# Patient Record
Sex: Female | Born: 1980 | Race: White | Hispanic: No | Marital: Married | Smoking: Never smoker
Health system: Southern US, Community
[De-identification: ages and names within clinical notes are randomized; demographics above are authoritative.]

## PROBLEM LIST (undated history)

## (undated) DIAGNOSIS — A630 Anogenital (venereal) warts: Secondary | ICD-10-CM

## (undated) DIAGNOSIS — B019 Varicella without complication: Secondary | ICD-10-CM

## (undated) HISTORY — DX: Varicella without complication: B01.9

## (undated) HISTORY — DX: Anogenital (venereal) warts: A63.0

---

## 2004-06-05 ENCOUNTER — Other Ambulatory Visit: Admission: RE | Admit: 2004-06-05 | Discharge: 2004-06-05 | Payer: Self-pay | Admitting: Obstetrics and Gynecology

## 2004-06-06 ENCOUNTER — Other Ambulatory Visit: Admission: RE | Admit: 2004-06-06 | Discharge: 2004-06-06 | Payer: Self-pay | Admitting: Obstetrics and Gynecology

## 2005-06-09 ENCOUNTER — Other Ambulatory Visit: Admission: RE | Admit: 2005-06-09 | Discharge: 2005-06-09 | Payer: Self-pay | Admitting: Obstetrics and Gynecology

## 2008-05-22 ENCOUNTER — Ambulatory Visit: Payer: Self-pay | Admitting: Family Medicine

## 2008-05-23 ENCOUNTER — Encounter (INDEPENDENT_AMBULATORY_CARE_PROVIDER_SITE_OTHER): Payer: Self-pay | Admitting: *Deleted

## 2008-05-23 LAB — CONVERTED CEMR LAB
AST: 25 units/L (ref 0–37)
Alkaline Phosphatase: 38 units/L — ABNORMAL LOW (ref 39–117)
BUN: 10 mg/dL (ref 6–23)
Basophils Absolute: 0.1 10*3/uL (ref 0.0–0.1)
Bilirubin, Direct: 0.1 mg/dL (ref 0.0–0.3)
Chloride: 104 meq/L (ref 96–112)
Eosinophils Absolute: 0.1 10*3/uL (ref 0.0–0.7)
Eosinophils Relative: 1.8 % (ref 0.0–5.0)
GFR calc Af Amer: 155 mL/min
GFR calc non Af Amer: 128 mL/min
HDL: 58.8 mg/dL (ref >39.0)
MCV: 94 fL (ref 78.0–100.0)
Neutrophils Relative %: 61 % (ref 43.0–77.0)
Platelets: 288 10*3/uL (ref 150–400)
Potassium: 3.5 meq/L (ref 3.5–5.1)
RDW: 12.7 % (ref 11.5–14.6)
Sodium: 137 meq/L (ref 135–145)
Total Bilirubin: 0.7 mg/dL (ref 0.3–1.2)
Triglycerides: 77 mg/dL (ref 0–149)
VLDL: 15 mg/dL (ref 0–40)
WBC: 6.9 10*3/uL (ref 4.5–10.5)

## 2012-04-19 ENCOUNTER — Ambulatory Visit (INDEPENDENT_AMBULATORY_CARE_PROVIDER_SITE_OTHER): Payer: BC Managed Care – PPO | Admitting: Family Medicine

## 2012-04-19 ENCOUNTER — Encounter: Payer: Self-pay | Admitting: Family Medicine

## 2012-04-19 VITALS — BP 108/80 | HR 74 | Temp 98.4°F | Ht 61.25 in | Wt 161.8 lb

## 2012-04-19 DIAGNOSIS — Z13220 Encounter for screening for lipoid disorders: Secondary | ICD-10-CM

## 2012-04-19 DIAGNOSIS — Z0000 Encounter for general adult medical examination without abnormal findings: Secondary | ICD-10-CM

## 2012-04-19 NOTE — Assessment & Plan Note (Signed)
Pt's PE WNL.  UTD on GYN.  Flu shot given.  Check labs.  Anticipatory guidance provided.  

## 2012-04-19 NOTE — Progress Notes (Signed)
  Subjective:    Patient ID: Debbie Arroyo, female    DOB: 1981/07/27, 31 y.o.   MRN: [id number]  HPI CPE- UTD on GYN Dareen Piano).  No concerns today.  Would like a flu shot.   Review of Systems Patient reports no vision/ hearing changes, adenopathy,fever, weight change,  persistant/recurrent hoarseness , swallowing issues, chest pain, palpitations, edema, persistant/recurrent cough, hemoptysis, dyspnea (rest/exertional/paroxysmal nocturnal), gastrointestinal bleeding (melena, rectal bleeding), abdominal pain, significant heartburn, bowel changes, GU symptoms (dysuria, hematuria, incontinence), Gyn symptoms (abnormal  bleeding, pain),  syncope, focal weakness, memory loss, numbness & tingling, skin/hair/nail changes, abnormal bruising or bleeding, anxiety, or depression.     Objective:   Physical Exam General Appearance:    Alert, cooperative, no distress, appears stated age  Head:    Normocephalic, without obvious abnormality, atraumatic  Eyes:    PERRL, conjunctiva/corneas clear, EOM's intact, fundi    benign, both eyes  Ears:    Normal TM's and external ear canals, both ears  Nose:   Nares normal, septum midline, mucosa normal, no drainage    or sinus tenderness  Throat:   Lips, mucosa, and tongue normal; teeth and gums normal  Neck:   Supple, symmetrical, trachea midline, no adenopathy;    Thyroid: no enlargement/tenderness/nodules  Back:     Symmetric, no curvature, ROM normal, no CVA tenderness  Lungs:     Clear to auscultation bilaterally, respirations unlabored  Chest Wall:    No tenderness or deformity   Heart:    Regular rate and rhythm, S1 and S2 normal, no murmur, rub   or gallop  Breast Exam:    Deferred to GYN  Abdomen:     Soft, non-tender, bowel sounds active all four quadrants,    no masses, no organomegaly  Genitalia:    Deferred to GYN  Rectal:    Extremities:   Extremities normal, atraumatic, no cyanosis or edema  Pulses:   2+ and symmetric all extremities    Skin:   Skin color, texture, turgor normal, no rashes or lesions  Lymph nodes:   Cervical, supraclavicular, and axillary nodes normal  Neurologic:   CNII-XII intact, normal strength, sensation and reflexes    throughout          Assessment & Plan:

## 2012-04-19 NOTE — Patient Instructions (Addendum)
Follow up in 1 year or as needed You look great!  Keep up the good work! We'll notify you of your lab results Call with any questions or concerns CONGRATS on your marriage!!!

## 2012-04-20 LAB — BASIC METABOLIC PANEL
BUN: 11 mg/dL (ref 6–23)
Calcium: 8.6 mg/dL (ref 8.4–10.5)
Chloride: 97 mEq/L (ref 96–112)
Creatinine, Ser: 0.7 mg/dL (ref 0.4–1.2)

## 2012-04-20 LAB — LDL CHOLESTEROL, DIRECT: Direct LDL: 140.2 mg/dL

## 2012-04-20 LAB — LIPID PANEL
Cholesterol: 210 mg/dL — ABNORMAL HIGH (ref 0–200)
Total CHOL/HDL Ratio: 4
Triglycerides: 121 mg/dL (ref 0.0–149.0)

## 2012-04-20 LAB — CBC WITH DIFFERENTIAL/PLATELET
Basophils Absolute: 0.1 10*3/uL (ref 0.0–0.1)
Basophils Relative: 0.9 % (ref 0.0–3.0)
Eosinophils Absolute: 0.1 10*3/uL (ref 0.0–0.7)
HCT: 38.7 % (ref 36.0–46.0)
Hemoglobin: 12.6 g/dL (ref 12.0–15.0)
Lymphs Abs: 2.6 10*3/uL (ref 0.7–4.0)
MCHC: 32.7 g/dL (ref 30.0–36.0)
MCV: 94.3 fl (ref 78.0–100.0)
Neutro Abs: 4.8 10*3/uL (ref 1.4–7.7)
RBC: 4.1 Mil/uL (ref 3.87–5.11)
RDW: 13.2 % (ref 11.5–14.6)

## 2012-04-20 LAB — HEPATIC FUNCTION PANEL
ALT: 18 U/L (ref 0–35)
Bilirubin, Direct: 0 mg/dL (ref 0.0–0.3)
Total Bilirubin: 0.6 mg/dL (ref 0.3–1.2)
Total Protein: 7.6 g/dL (ref 6.0–8.3)

## 2012-04-20 LAB — TSH: TSH: 0.86 u[IU]/mL (ref 0.35–5.50)

## 2012-04-22 ENCOUNTER — Encounter: Payer: Self-pay | Admitting: *Deleted

## 2012-04-23 LAB — VITAMIN D 1,25 DIHYDROXY: Vitamin D2 1, 25 (OH)2: <8 pg/mL

## 2012-04-26 ENCOUNTER — Telehealth: Payer: Self-pay | Admitting: Family Medicine

## 2012-04-26 NOTE — Telephone Encounter (Signed)
returned your call regarding labs ok to call back later today  Cb# [phone]

## 2012-04-26 NOTE — Telephone Encounter (Signed)
Pt.notified

## 2013-11-30 ENCOUNTER — Other Ambulatory Visit (HOSPITAL_COMMUNITY): Payer: Self-pay | Admitting: Obstetrics and Gynecology

## 2013-11-30 DIAGNOSIS — O200 Threatened abortion: Secondary | ICD-10-CM

## 2013-12-01 ENCOUNTER — Ambulatory Visit (HOSPITAL_COMMUNITY)
Admission: RE | Admit: 2013-12-01 | Discharge: 2013-12-01 | Disposition: A | Discharge status: Home / Self Care | Payer: BC Managed Care – PPO | Source: Ambulatory Visit | Attending: Obstetrics and Gynecology | Admitting: Obstetrics and Gynecology

## 2013-12-01 DIAGNOSIS — O200 Threatened abortion: Secondary | ICD-10-CM

## 2013-12-01 DIAGNOSIS — O208 Other hemorrhage in early pregnancy: Secondary | ICD-10-CM | POA: Insufficient documentation

## 2015-02-18 IMAGING — US US OB COMP LESS 14 WK
1 series · 14 of 28 positions shown · non-contrast
Comparison: None.

CLINICAL DATA: Pregnant, vaginal bleeding

EXAM:
OBSTETRIC <14 WK US AND TRANSVAGINAL OB US
TECHNIQUE: Both transabdominal and transvaginal ultrasound examinations were
performed for complete evaluation of the gestation as well as the
maternal uterus, adnexal regions, and pelvic cul-de-sac.
Transvaginal technique was performed to assess early pregnancy.

[Series 1: us ob transvaginal · 51 acquisitions, 14 frames shown]
[im 2/51]
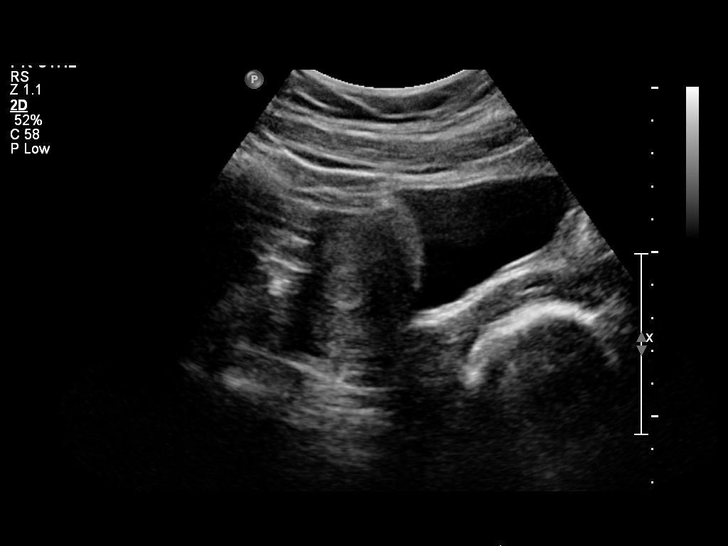
[im 6/51]
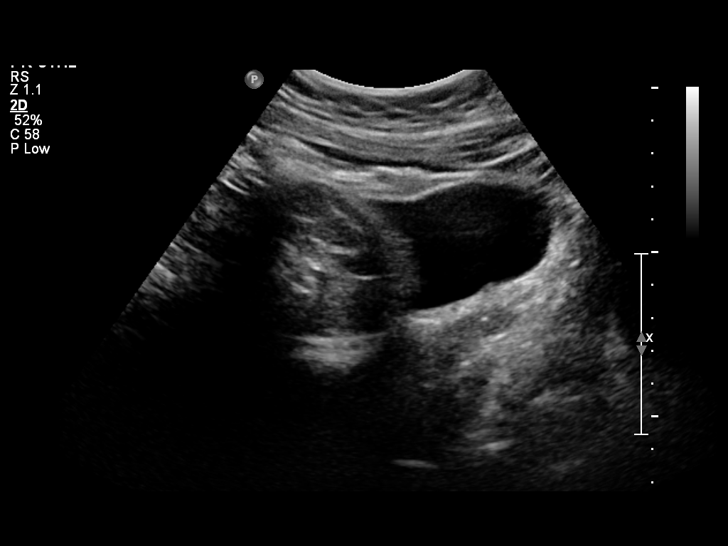
[im 10/51]
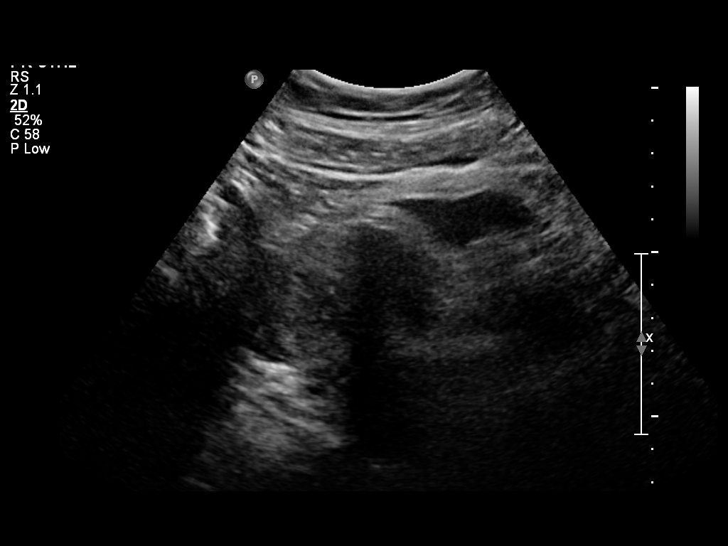
[im 13/51]
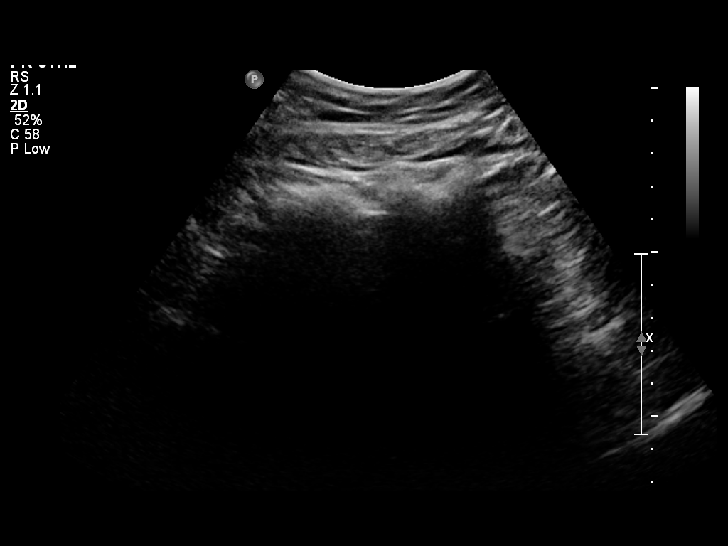
[im 17/51]
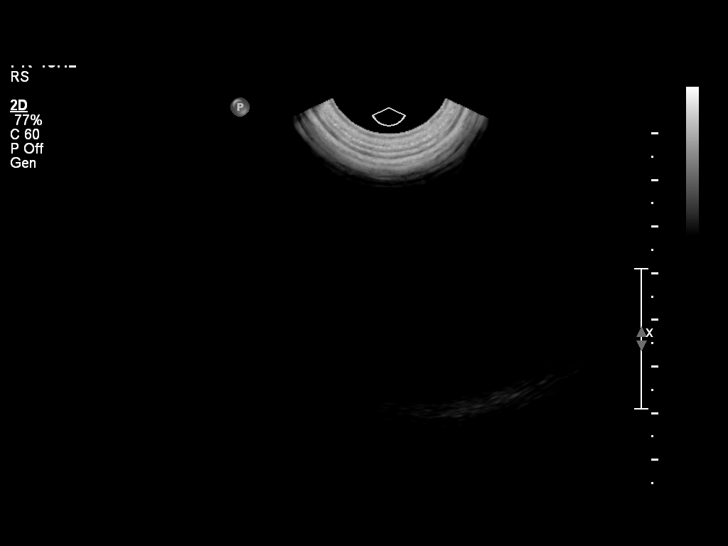
[im 21/51]
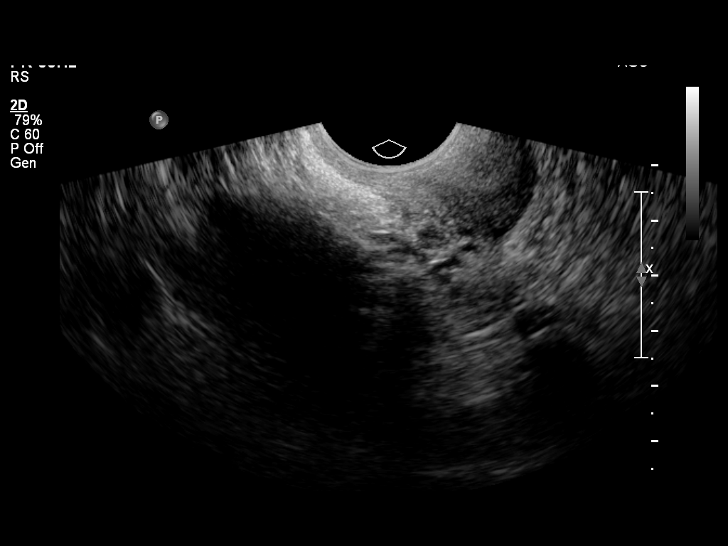
[im 25/51]
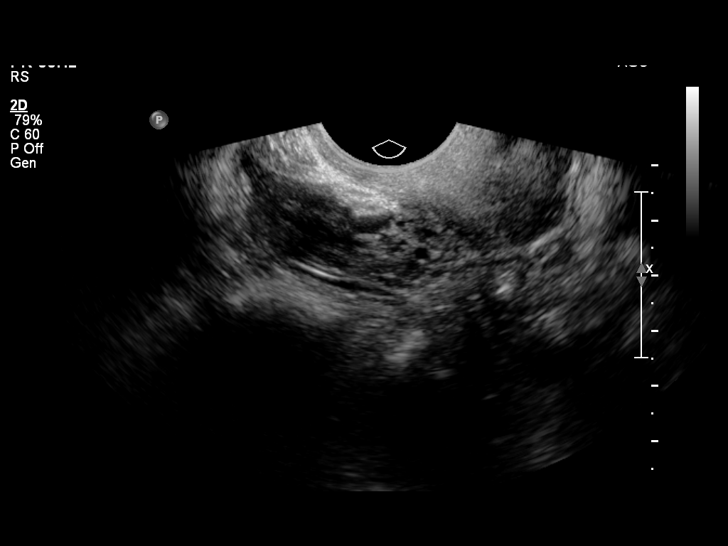
[im 28/51]
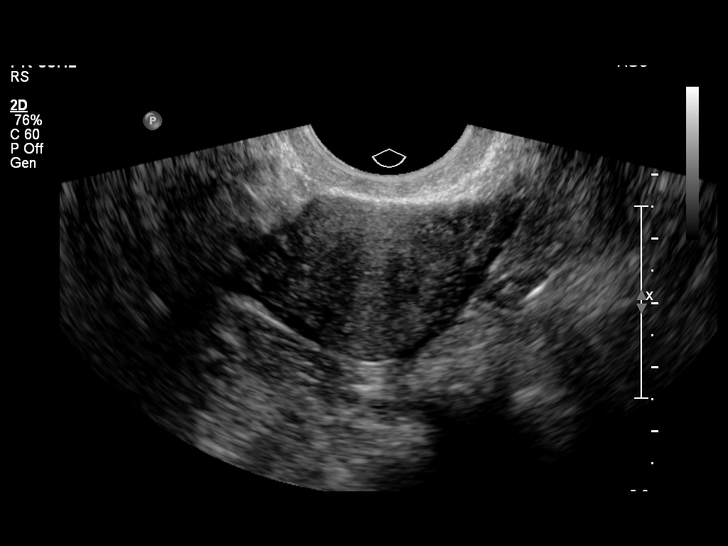
[im 32/51]
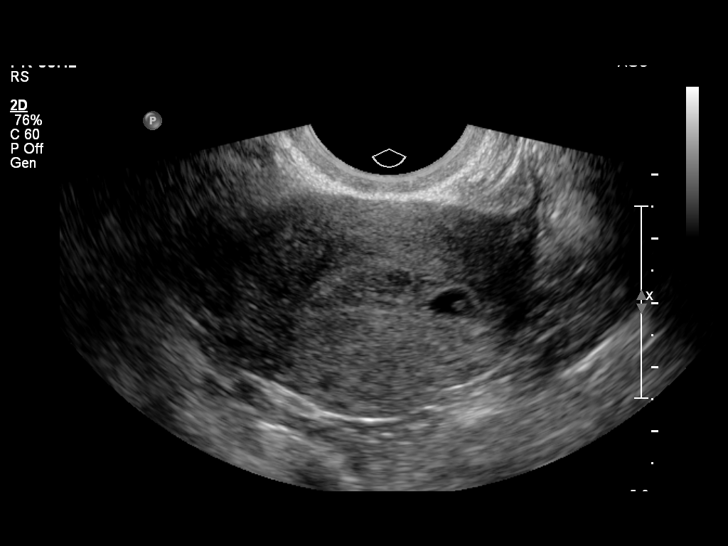
[im 36/51]
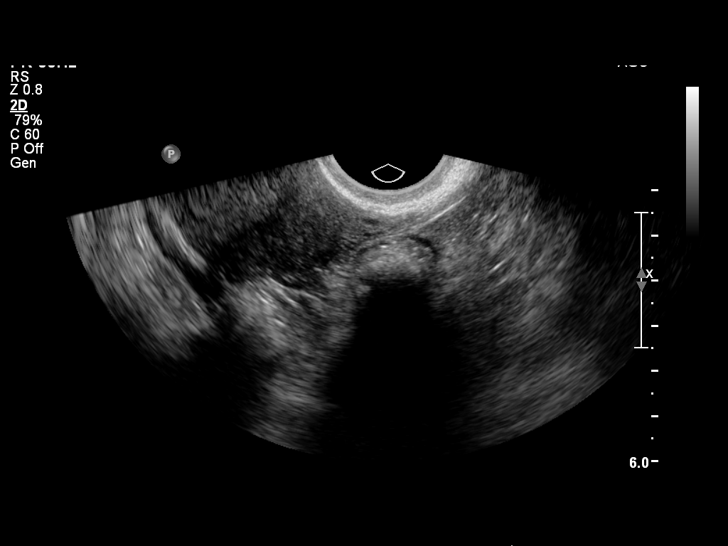
[im 39/51]
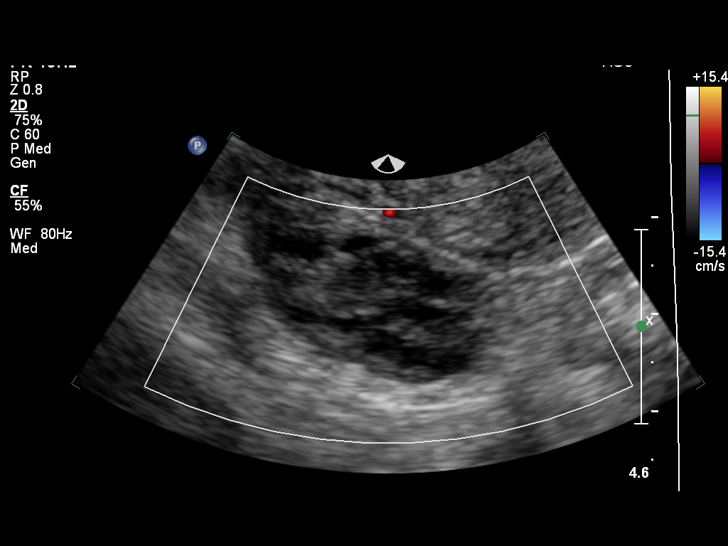
[im 43/51]
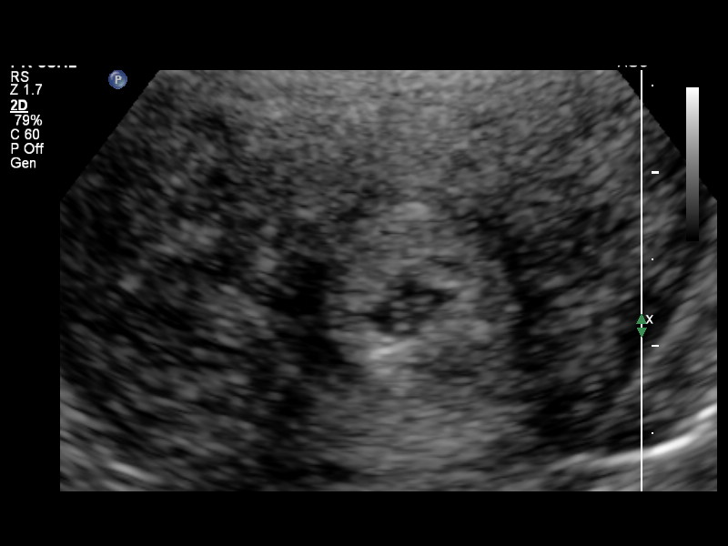
[im 47/51]
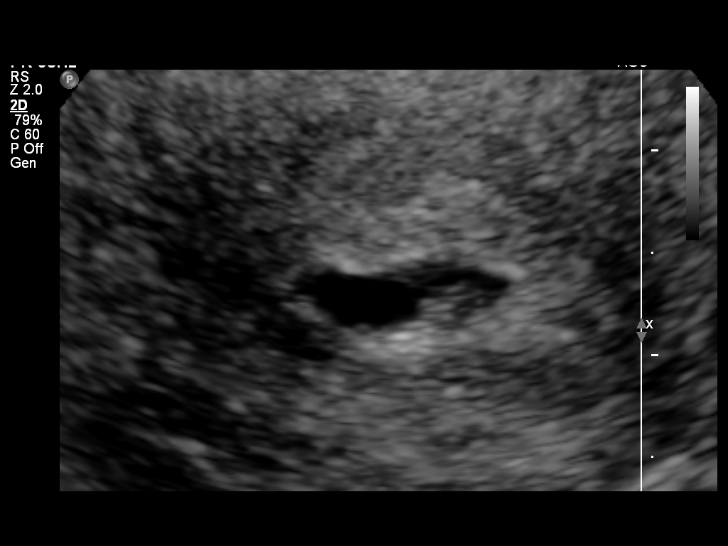
[im 51/51]
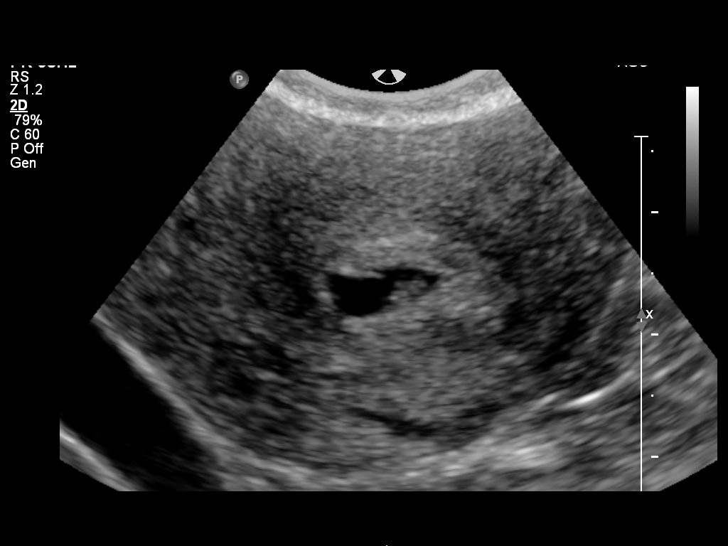

[14 of 28 positions shown; findings below may reference images not displayed]

FINDINGS: Intrauterine gestational sac: Visualize, irregular in shape.

Yolk sac:  Not visualized.

Embryo:  Present.

Cardiac Activity: Present.

Heart Rate:  81 bpm

CRL:   3  mm   6 w 0 d                  US EDC: 07/27/2014

Maternal uterus/adnexae: Moderate subchronic hemorrhage.

Right ovary is within normal limits, measuring 1.4 x 2.7 x 1.9 cm.

Left ovary is not discretely visualized.

No free fluid.
IMPRESSION: Single live intrauterine gestation with estimated gestational age 6
weeks 0 days by crown-rump length.

Fetal heart rate 81 bpm.
